# Patient Record
Sex: Male | Born: 1975 | Race: White | Hispanic: No | Marital: Married | Smoking: Never smoker
Health system: Southern US, Community
[De-identification: ages and names within clinical notes are randomized; demographics above are authoritative.]

## PROBLEM LIST (undated history)

## (undated) DIAGNOSIS — I10 Essential (primary) hypertension: Secondary | ICD-10-CM

## (undated) DIAGNOSIS — E119 Type 2 diabetes mellitus without complications: Secondary | ICD-10-CM

## (undated) HISTORY — PX: KNEE SURGERY: SHX244

---

## 2013-06-29 ENCOUNTER — Emergency Department (HOSPITAL_COMMUNITY)
Admission: EM | Admit: 2013-06-29 | Discharge: 2013-06-29 | Disposition: A | Discharge status: Home / Self Care | Payer: 59 | Attending: Emergency Medicine | Admitting: Emergency Medicine

## 2013-06-29 ENCOUNTER — Encounter (HOSPITAL_COMMUNITY): Payer: Self-pay | Admitting: Emergency Medicine

## 2013-06-29 DIAGNOSIS — S61409A Unspecified open wound of unspecified hand, initial encounter: Secondary | ICD-10-CM | POA: Insufficient documentation

## 2013-06-29 DIAGNOSIS — Y929 Unspecified place or not applicable: Secondary | ICD-10-CM | POA: Insufficient documentation

## 2013-06-29 DIAGNOSIS — Y9389 Activity, other specified: Secondary | ICD-10-CM | POA: Insufficient documentation

## 2013-06-29 DIAGNOSIS — Z23 Encounter for immunization: Secondary | ICD-10-CM | POA: Insufficient documentation

## 2013-06-29 DIAGNOSIS — W261XXA Contact with sword or dagger, initial encounter: Secondary | ICD-10-CM

## 2013-06-29 DIAGNOSIS — Z79899 Other long term (current) drug therapy: Secondary | ICD-10-CM | POA: Insufficient documentation

## 2013-06-29 DIAGNOSIS — IMO0002 Reserved for concepts with insufficient information to code with codable children: Secondary | ICD-10-CM

## 2013-06-29 DIAGNOSIS — I10 Essential (primary) hypertension: Secondary | ICD-10-CM | POA: Insufficient documentation

## 2013-06-29 DIAGNOSIS — E119 Type 2 diabetes mellitus without complications: Secondary | ICD-10-CM | POA: Insufficient documentation

## 2013-06-29 DIAGNOSIS — W260XXA Contact with knife, initial encounter: Secondary | ICD-10-CM | POA: Insufficient documentation

## 2013-06-29 HISTORY — DX: Essential (primary) hypertension: I10

## 2013-06-29 HISTORY — DX: Type 2 diabetes mellitus without complications: E11.9

## 2013-06-29 MED ORDER — TETANUS-DIPHTH-ACELL PERTUSSIS 5-2.5-18.5 LF-MCG/0.5 IM SUSP: 0.5000 mL | Freq: Once | INTRAMUSCULAR | Status: DC

## 2013-06-29 MED ORDER — TETANUS-DIPHTH-ACELL PERTUSSIS 5-2.5-18.5 LF-MCG/0.5 IM SUSP
0.5000 mL | Freq: Once | INTRAMUSCULAR | Status: AC
  Administered 2013-06-29: 0.5 mL via INTRAMUSCULAR
  Filled 2013-06-29: qty 0.5

## 2013-06-29 NOTE — Discharge Instructions (Signed)
WOUND CARE Please have your stitches/staples removed in 7 days or sooner if you have concerns. You may do this at any available urgent care or at your primary care doctor's office.  Keep area clean and dry for 24 hours. Do not remove bandage, if applied.  After 24 hours, remove bandage and wash wound gently with mild soap and warm water. Reapply a new bandage after cleaning wound, if directed.  Continue daily cleansing with soap and water until stitches/staples are removed.  Do not apply any ointments or creams to the wound while stitches/staples are in place, as this may cause delayed healing.  Seek medical careif you experience any of the following signs of infection: Swelling, redness, pus drainage, streaking, fever >101.0 F  Seek care if you experience excessive bleeding that does not stop after 15-20 minutes of constant, firm Pressure.  Laceration Care, Adult A laceration is a cut or lesion that goes through all layers of the skin and into the tissue just beneath the skin. TREATMENT  Some lacerations may not require closure. Some lacerations may not be able to be closed due to an increased risk of infection. It is important to see your caregiver as soon as possible after an injury to minimize the risk of infection and maximize the opportunity for successful closure. If closure is appropriate, pain medicines may be given, if needed. The wound will be cleaned to help prevent infection. Your caregiver will use stitches (sutures), staples, wound glue (adhesive), or skin adhesive strips to repair the laceration. These tools bring the skin edges together to allow for faster healing and a better cosmetic outcome. However, all wounds will heal with a scar. Once the wound has healed, scarring can be minimized by covering the wound with sunscreen during the day for 1 full year. HOME CARE INSTRUCTIONS  For sutures or staples:  Keep the wound clean and dry.  If you were given a bandage  (dressing), you should change it at least once a day. Also, change the dressing if it becomes wet or dirty, or as directed by your caregiver.  Wash the wound with soap and water 2 times a day. Rinse the wound off with water to remove all soap. Pat the wound dry with a clean towel.  After cleaning, apply a thin layer of the antibiotic ointment as recommended by your caregiver. This will help prevent infection and keep the dressing from sticking.  You may shower as usual after the first 24 hours. Do not soak the wound in water until the sutures are removed.  Only take over-the-counter or prescription medicines for pain, discomfort, or fever as directed by your caregiver.  Get your sutures or staples removed as directed by your caregiver. For skin adhesive strips:  Keep the wound clean and dry.  Do not get the skin adhesive strips wet. You may bathe carefully, using caution to keep the wound dry.  If the wound gets wet, pat it dry with a clean towel.  Skin adhesive strips will fall off on their own. You may trim the strips as the wound heals. Do not remove skin adhesive strips that are still stuck to the wound. They will fall off in time. For wound adhesive:  You may briefly wet your wound in the shower or bath. Do not soak or scrub the wound. Do not swim. Avoid periods of heavy perspiration until the skin adhesive has fallen off on its own. After showering or bathing, gently pat the wound dry with a  clean towel.  Do not apply liquid medicine, cream medicine, or ointment medicine to your wound while the skin adhesive is in place. This may loosen the film before your wound is healed.  If a dressing is placed over the wound, be careful not to apply tape directly over the skin adhesive. This may cause the adhesive to be pulled off before the wound is healed.  Avoid prolonged exposure to sunlight or tanning lamps while the skin adhesive is in place. Exposure to ultraviolet light in the first  year will darken the scar.  The skin adhesive will usually remain in place for 5 to 10 days, then naturally fall off the skin. Do not pick at the adhesive film. You may need a tetanus shot if:  You cannot remember when you had your last tetanus shot.  You have never had a tetanus shot. If you get a tetanus shot, your arm may swell, get red, and feel warm to the touch. This is common and not a problem. If you need a tetanus shot and you choose not to have one, there is a rare chance of getting tetanus. Sickness from tetanus can be serious. SEEK MEDICAL CARE IF:   You have redness, swelling, or increasing pain in the wound.  You see a red line that goes away from the wound.  You have yellowish-white fluid (pus) coming from the wound.  You have a fever.  You notice a bad smell coming from the wound or dressing.  Your wound breaks open before or after sutures have been removed.  You notice something coming out of the wound such as wood or glass.  Your wound is on your hand or foot and you cannot move a finger or toe. SEEK IMMEDIATE MEDICAL CARE IF:   Your pain is not controlled with prescribed medicine.  You have severe swelling around the wound causing pain and numbness or a change in color in your arm, hand, leg, or foot.  Your wound splits open and starts bleeding.  You have worsening numbness, weakness, or loss of function of any joint around or beyond the wound.  You develop painful lumps near the wound or on the skin anywhere on your body. MAKE SURE YOU:   Understand these instructions.  Will watch your condition.  Will get help right away if you are not doing well or get worse. Document Released: 03/03/2005 Document Revised: 05/26/2011 Document Reviewed: 08/27/2010 [facility] Patient Information 2014 [place], [place].

## 2013-06-29 NOTE — ED Notes (Signed)
Bleeding controlled and pt cleaned right hand before coming POV to this facility

## 2013-06-29 NOTE — ED Notes (Addendum)
C/o approx 2-3 cm laceration to R hand x 45 min.  Pt states he was cutting an avocado.  Last DT unknown.  Bleeding controlled pta.

## 2013-06-29 NOTE — ED Provider Notes (Signed)
CSN: [id number]     Arrival date & time 06/29/13  1921 History   First MD Initiated Contact with Patient 06/29/13 1949     This chart was scribed for non-physician practitioner, Arthor CaptainAbigail Mozetta Murfin, PA-C, working with Gwyneth SproutWhitney Plunkett, MD by Arlan OrganAshley Leger, ED Scribe. This patient was seen in room TR04C/TR04C and the patient's care was started at 8:21 PM.   Chief Complaint  Patient presents with  . Extremity Laceration   The history is provided by the patient. No language interpreter was used.    HPI Comments: [place] Saffran is a 38 y.o. male who presents to the Emergency Department complaining of an extremity laceration to the base of the R second digit that occured just prior to arrival. Pt states he was attempting to cut an avacado with a brand new knife and severed his finger accidentally. He states he ran cold water over the wound after the incident. However, he denies applying anything topical to the wound. He describes his current pain as throbbing and rates it 5/10. Denies any loss of sensation or numbness. Pt unaware of last tetanus shout. His PMHx includes DM and HTN. No other concerns this visit.  Past Medical History  Diagnosis Date  . Diabetes mellitus without complication   . Hypertension    Past Surgical History  Procedure Laterality Date  . Knee surgery     No family history on file. History  Substance Use Topics  . Smoking status: Never Smoker   . Smokeless tobacco: Not on file  . Alcohol Use: Yes    Review of Systems  Constitutional: Negative for fever and chills.  HENT: Negative for congestion.   Eyes: Negative for redness.  Respiratory: Negative for cough.   Skin: Positive for wound (Laceration to R 2nd digit).  Psychiatric/Behavioral: Negative for confusion.      Allergies  Review of patient's allergies indicates no known allergies.  Home Medications   Prior to Admission medications   Medication Sig Start Date End Date Taking? Authorizing Provider   bisoprolol-hydrochlorothiazide (ZIAC) 2.5-6.25 MG per tablet Take 1 tablet by mouth daily.   Yes Historical Provider, MD  metFORMIN (GLUCOPHAGE-XR) 750 MG 24 hr tablet Take 750 mg by mouth daily with breakfast.   Yes Historical Provider, MD  Multiple Vitamin (MULTIVITAMIN WITH MINERALS) TABS tablet Take 1 tablet by mouth daily.   Yes Historical Provider, MD    Triage Vitals: BP 131/76  Pulse 83  Temp(Src) 98.4 F (36.9 C) (Oral)  Resp 16  Ht 5\' 7"  (1.702 m)  Wt 275 lb (124.739 kg)  BMI 43.06 kg/m2  SpO2 98%   Physical Exam  Nursing note and vitals reviewed. Constitutional: He is oriented to person, place, and time. He appears well-developed and well-nourished.  HENT:  Head: Normocephalic and atraumatic.  Eyes: EOM are normal.  Neck: Normal range of motion.  Cardiovascular: Normal rate.   Capillary refill less than 2 seconds  Pulmonary/Chest: Effort normal.  Musculoskeletal: Normal range of motion.  Neurological: He is alert and oriented to person, place, and time.  Skin: Skin is warm and dry.  Superficial 2cm lac to the 2nd mcp palmar surface  Psychiatric: He has a normal mood and affect. His behavior is normal.    ED Course  Procedures (including critical care time)  DIAGNOSTIC STUDIES: Oxygen Saturation is 98% on RA, Normal by my interpretation.    COORDINATION OF CARE: 8:23 PM- Will perform a laceration repair. Discussed treatment plan with pt at bedside and pt agreed  to plan.    8:24 PM-  LACERATION REPAIR Performed by: Arthor CaptainAbigail See Beharry, PA-C Consent: Verbal consent obtained. Risks and benefits: risks, benefits and alternatives were discussed Patient identity confirmed: provided demographic data Time out performed prior to procedure Prepped and Draped in normal sterile fashion Wound explored Laceration Location: Hypothenar eminence of the R second digit  Laceration Length: 2 cm No Foreign Bodies seen or palpated Anesthesia: local infiltration Local  anesthetic: lidocaine 2 % w/o epinephrine Anesthetic total: 3 ml Irrigation method: syringe Amount of cleaning: standard Skin closure: ethilon 5.0 Number of sutures or staples: 3 Technique: SI Patient tolerance: Patient tolerated the procedure well with no immediate complications.    Labs Review Labs Reviewed - No data to display  Imaging Review No results found.   EKG Interpretation None      MDM   Final diagnoses:  Laceration    Tdap booster given.Pressure irrigation performed. Laceration occurred < 8 hours prior to repair which was well tolerated. Pt has no co morbidities to effect normal wound healing. Discussed suture home care w pt and answered questions. Pt to f-u for wound check and suture removal in 7 days. Pt is hemodynamically stable w no complaints prior to dc.    I personally performed the services described in this documentation, which was scribed in my presence. The recorded information has been reviewed and is accurate.    Arthor CaptainAbigail Sukaina Toothaker, PA-C 06/30/13 [phone]

## 2013-07-01 NOTE — ED Provider Notes (Signed)
Medical screening examination/treatment/procedure(s) were performed by non-physician practitioner and as supervising physician I was immediately available for consultation/collaboration.   EKG Interpretation None        Gwyneth SproutWhitney Jaylanni Eltringham, MD 07/01/13 1556

## 2016-04-07 ENCOUNTER — Emergency Department (INDEPENDENT_AMBULATORY_CARE_PROVIDER_SITE_OTHER): Payer: BLUE CROSS/BLUE SHIELD

## 2016-04-07 ENCOUNTER — Emergency Department (INDEPENDENT_AMBULATORY_CARE_PROVIDER_SITE_OTHER)
Admission: EM | Admit: 2016-04-07 | Discharge: 2016-04-07 | Disposition: A | Discharge status: Home / Self Care | Payer: BLUE CROSS/BLUE SHIELD | Source: Home / Self Care | Attending: Family Medicine | Admitting: Family Medicine

## 2016-04-07 ENCOUNTER — Encounter: Payer: Self-pay | Admitting: *Deleted

## 2016-04-07 DIAGNOSIS — M25512 Pain in left shoulder: Secondary | ICD-10-CM

## 2016-04-07 DIAGNOSIS — W009XXA Unspecified fall due to ice and snow, initial encounter: Secondary | ICD-10-CM

## 2016-04-07 DIAGNOSIS — M898X1 Other specified disorders of bone, shoulder: Secondary | ICD-10-CM

## 2016-04-07 MED ORDER — TRAMADOL HCL 50 MG PO TABS: 50.0000 mg | ORAL_TABLET | Freq: Four times a day (QID) | ORAL | 0 refills | Status: AC | PRN

## 2016-04-07 NOTE — Discharge Instructions (Signed)
°  Tramadol is strong pain medication. While taking, do not drink alcohol, drive, or perform any other activities that requires focus while taking these medications.  ° °

## 2016-04-07 NOTE — ED Triage Notes (Signed)
Patient reports slipping on the ice while getting out if his truck 3 days ago. He landed on his arms. Now c/o left shoulder and collar bone pain. No previous injuries.

## 2016-04-07 NOTE — ED Provider Notes (Signed)
CSN: [id number]     Arrival date & time 04/07/16  1912 History   First MD Initiated Contact with Patient 04/07/16 1933     Chief Complaint  Patient presents with  . Shoulder Injury   (Consider location/radiation/quality/duration/timing/severity/associated sxs/prior Treatment) HPI  Tim Norman is a 41 y.o. male presenting to UC with c/o Left shoulder and clavicle pain that started 3 days ago after slip and fall on ice while getting out of his truck. Pt notes he landed mainly on his Left shoulder. Denies hitting his head or other injuries from the fall.  Pain is aching and sore, 4/10 at this time, worse with movement. He is able to reach his hand over his head but notes it is moderately to severely painful.  Pt is Left hand dominant.  No prior surgery or injury to Left shoulder.    Past Medical History:  Diagnosis Date  . Diabetes mellitus without complication (HCC)   . Hypertension    Past Surgical History:  Procedure Laterality Date  . KNEE SURGERY     History reviewed. No pertinent family history. Social History  Substance Use Topics  . Smoking status: Never Smoker  . Smokeless tobacco: Never Used  . Alcohol use Yes    Review of Systems  Musculoskeletal: Positive for arthralgias and myalgias.       Left shoulder and clavicle   Skin: Negative for color change and wound.  Neurological: Positive for weakness ( Left shoulder due to pain). Negative for numbness.    Allergies  Patient has no known allergies.  Home Medications   Prior to Admission medications   Medication Sig Start Date End Date Taking? Authorizing Provider  bisoprolol-hydrochlorothiazide (ZIAC) 2.5-6.25 MG per tablet Take 1 tablet by mouth daily.    Historical Provider, MD  metFORMIN (GLUCOPHAGE-XR) 750 MG 24 hr tablet Take 750 mg by mouth daily with breakfast.    Historical Provider, MD  Multiple Vitamin (MULTIVITAMIN WITH MINERALS) TABS tablet Take 1 tablet by mouth daily.    Historical Provider, MD    traMADol (ULTRAM) 50 MG tablet Take 1 tablet (50 mg total) by mouth every 6 (six) hours as needed. 04/07/16   Tim FinnerErin O'Malley, PA-C   Meds Ordered and Administered this Visit  Medications - No data to display  BP 130/82 (BP Location: Right Arm)   Pulse 83   Resp 14   Ht 5\' 6"  (1.676 m)   Wt 269 lb (122 kg)   SpO2 96%   BMI 43.42 kg/m  No data found.   Physical Exam  Constitutional: He is oriented to person, place, and time. He appears well-developed and well-nourished. No distress.  HENT:  Head: Normocephalic and atraumatic.  Eyes: EOM are normal.  Neck: Normal range of motion. Neck supple.  No midline cervical tenderness  Cardiovascular: Normal rate.   Pulses:      Radial pulses are 2+ on the left side.  Pulmonary/Chest: Effort normal.  Musculoskeletal: Normal range of motion. He exhibits tenderness. He exhibits no edema.       Arms: No obvious deformity. Tenderness to anterior aspect, worse over AC joint. No crepitus. Full ROM with increased pain on extremes of movements. 4/5 strength with abduction against resistance compared to Right arm.  Neurological: He is alert and oriented to person, place, and time.  Skin: Skin is warm and dry. Capillary refill takes less than 2 seconds. He is not diaphoretic.  Psychiatric: He has a normal mood and affect. His behavior is normal.  Nursing note and vitals reviewed.   Urgent Care Course     Procedures (including critical care time)  Labs Review Labs Reviewed - No data to display  Imaging Review Dg Clavicle Left  Result Date: 04/07/2016 CLINICAL DATA:  Status post fall on ice, with left shoulder and clavicular pain. Initial encounter. EXAM: LEFT CLAVICLE - 2+ VIEWS COMPARISON:  None. FINDINGS: There is no evidence of fracture or dislocation. The left humeral head is seated within the glenoid fossa. The acromioclavicular joint is unremarkable in appearance. No significant soft tissue abnormalities are seen. The visualized portions  of the left lung are clear. IMPRESSION: No evidence of fracture or dislocation. Electronically Signed   By: Roanna Raider M.D.   On: 04/07/2016 20:05   Dg Shoulder Left  Result Date: 04/07/2016 CLINICAL DATA:  Fall 4 days ago with left shoulder pain, initial encounter EXAM: LEFT SHOULDER - 2+ VIEW COMPARISON:  None. FINDINGS: There is no evidence of fracture or dislocation. There is no evidence of arthropathy or other focal bone abnormality. Soft tissues are unremarkable. IMPRESSION: No acute abnormality noted. Electronically Signed   By: Alcide Clever M.D.   On: 04/07/2016 19:51     MDM   1. Left shoulder pain   2. Fall from slipping on ice, initial encounter   3. Pain of left clavicle    Pt c/o Left shoulder pain after slip and fall on ice 3 days ago. No other injuries.   Plain films: Negative for fracture or dislocation  Sling applied for comfort. Encouraged to do ROM exercises when possible to help prevent stiffening of his shoulder. Rx: Tramadol for severe pain at night. F/u with PCP or Sports Medicine in 1-2 weeks if not improving. Patient verbalized understanding and agreement with treatment plan.    Tim Finner, PA-C 04/08/16 [phone]

## 2021-01-28 ENCOUNTER — Emergency Department (INDEPENDENT_AMBULATORY_CARE_PROVIDER_SITE_OTHER): Payer: BC Managed Care – PPO

## 2021-01-28 ENCOUNTER — Emergency Department (INDEPENDENT_AMBULATORY_CARE_PROVIDER_SITE_OTHER)
Admission: EM | Admit: 2021-01-28 | Discharge: 2021-01-28 | Disposition: A | Discharge status: Home / Self Care | Payer: BC Managed Care – PPO | Source: Home / Self Care

## 2021-01-28 ENCOUNTER — Other Ambulatory Visit: Payer: Self-pay

## 2021-01-28 DIAGNOSIS — M25572 Pain in left ankle and joints of left foot: Secondary | ICD-10-CM

## 2021-01-28 DIAGNOSIS — M25571 Pain in right ankle and joints of right foot: Secondary | ICD-10-CM

## 2021-01-28 MED ORDER — CELECOXIB 100 MG PO CAPS: 100.0000 mg | ORAL_CAPSULE | Freq: Two times a day (BID) | ORAL | 0 refills | Status: AC

## 2021-01-28 MED ORDER — PREDNISONE 20 MG PO TABS: ORAL_TABLET | ORAL | 0 refills | Status: AC

## 2021-01-28 NOTE — ED Provider Notes (Signed)
[name]    CSN: [id number] Arrival date & time: 01/28/21  1525      History   Chief Complaint Chief Complaint  Patient presents with   Ankle Pain    RT    HPI Tim Norman is a 45 y.o. male.   HPI 45 year old male presents with right ankle on and off for 2 weeks.  Denies injury.  Patient reports hurts to flex right foot upward/downward.  Reports works for [employer] and on his feet a lot.  Patient reports that right ankle pain radiates upward to leg.   Past Medical History:  Diagnosis Date   Diabetes mellitus without complication (HCC)    Hypertension     There are no problems to display for this patient.   Past Surgical History:  Procedure Laterality Date   KNEE SURGERY         Home Medications    Prior to Admission medications   Medication Sig Start Date End Date Taking? Authorizing Provider  benazepril (LOTENSIN) 5 MG tablet Take 1 tablet by mouth daily. 10/24/18  Yes [provider]  celecoxib (CELEBREX) 100 MG capsule Take 1 capsule (100 mg total) by mouth 2 (two) times daily for 15 days. 01/28/21 02/12/21 Yes Trevor Iha, FNP  predniSONE (DELTASONE) 20 MG tablet Take 3 tabs PO daily x 5 days. 01/28/21  Yes Trevor Iha, FNP  atorvastatin (LIPITOR) 10 MG tablet Take 10 mg by mouth daily. 12/07/20   [provider]  bisoprolol-hydrochlorothiazide (ZIAC) 2.5-6.25 MG per tablet Take 1 tablet by mouth daily.    [provider]  Continuous Blood Gluc Receiver (DEXCOM G6 RECEIVER) DEVI USE AS DIRECTED FOR CONTINUOUS GLUCOSE MONITORING 01/08/21   [provider]  glipiZIDE (GLUCOTROL XL) 2.5 MG 24 hr tablet Take 2.5 mg by mouth daily. 12/17/20   [provider]  metFORMIN (GLUCOPHAGE-XR) 750 MG 24 hr tablet Take 750 mg by mouth daily with breakfast.    [provider]  Multiple Vitamin (MULTIVITAMIN WITH MINERALS) TABS tablet Take 1 tablet by mouth daily.    [provider]  traMADol (ULTRAM)  50 MG tablet Take 1 tablet (50 mg total) by mouth every 6 (six) hours as needed. 04/07/16   Lurene Shadow, PA-C    Family History History reviewed. No pertinent family history.  Social History Social History   Tobacco Use   Smoking status: Never   Smokeless tobacco: Never  Substance Use Topics   Alcohol use: Yes   Drug use: No     Allergies   Patient has no known allergies.   Review of Systems Review of Systems  Musculoskeletal:        Right ankle pain on and off for 2 weeks.  All other systems reviewed and are negative.   Physical Exam Triage Vital Signs ED Triage Vitals  Enc Vitals Group     BP 01/28/21 1548 123/84     Pulse Rate 01/28/21 1548 90     Resp 01/28/21 1548 18     Temp 01/28/21 1548 98.6 F (37 C)     Temp Source 01/28/21 1548 Oral     SpO2 01/28/21 1548 97 %     Weight --      Height --      Head Circumference --      Peak Flow --      Pain Score 01/28/21 1550 6     Pain Loc --      Pain Edu? --  Excl. in GC? --    No data found.  Updated Vital Signs BP 123/84 (BP Location: Right Arm)   Pulse 90   Temp 98.6 F (37 C) (Oral)   Resp 18   SpO2 97%      Physical Exam Vitals reviewed.  Constitutional:      General: He is not in acute distress.    Appearance: Normal appearance. He is obese. He is not ill-appearing.  HENT:     Mouth/Throat:     Mouth: Mucous membranes are moist.  Eyes:     Extraocular Movements: Extraocular movements intact.     Conjunctiva/sclera: Conjunctivae normal.     Pupils: Pupils are equal, round, and reactive to light.  Cardiovascular:     Rate and Rhythm: Normal rate and regular rhythm.     Pulses: Normal pulses.     Heart sounds: Normal heart sounds.  Pulmonary:     Effort: Pulmonary effort is normal.     Breath sounds: Normal breath sounds.  Musculoskeletal:     Cervical back: Normal range of motion and neck supple.     Comments: Right ankle: TTP just beneath the lateral malleolus over anterior  talofibular ligament/distal lateral fibula area with mild soft tissue swelling noted.  Skin:    General: Skin is warm and dry.  Neurological:     General: No focal deficit present.     Mental Status: He is alert and oriented to person, place, and time.     UC Treatments / Results  Labs (all labs ordered are listed, but only abnormal results are displayed) Labs Reviewed - No data to display  EKG   Radiology DG Ankle Complete Right  Result Date: 01/28/2021 CLINICAL DATA:  Right ankle pain. EXAM: RIGHT ANKLE - COMPLETE 3+ VIEW COMPARISON:  None. FINDINGS: There is no evidence of fracture, dislocation, or joint effusion. There is no evidence of arthropathy or other focal bone abnormality. Plantar and posterior calcaneal spurs are present. Os trigonum is present. Soft tissues are unremarkable. IMPRESSION: Negative. Electronically Signed   By: Darliss Cheney M.D.   On: 01/28/2021 17:03    Procedures Procedures (including critical care time)  Medications Ordered in UC Medications - No data to display  Initial Impression / Assessment and Plan / UC Course  I have reviewed the triage vital signs and the nursing notes.  Pertinent labs & imaging results that were available during my care of the patient were reviewed by me and considered in my medical decision making (see chart for details).     MDM: 1.  Right ankle pain-right ankle x-ray revealed (above), Rx'd Celebrex and Prednisone burst. Advised patient to take medication as directed with food to completion.  Advised patient if symptoms worsen and/or unresolved please follow-up with [facility] orthopedic provider (contact information provided above) for further evaluation.    Final Clinical Impressions(s) / UC Diagnoses   Final diagnoses:  Acute right ankle pain     Discharge Instructions      Advised patient to take medications as directed with food to completion.  Advised patient if symptoms worsen and/or unresolved please  follow-up with [facility] orthopedic provider (contact information provided above) for further evaluation.     ED Prescriptions     Medication Sig Dispense Auth. Provider   celecoxib (CELEBREX) 100 MG capsule Take 1 capsule (100 mg total) by mouth 2 (two) times daily for 15 days. 30 capsule Trevor Iha, FNP   predniSONE (DELTASONE) 20 MG tablet Take  3 tabs PO daily x 5 days. 15 tablet Trevor Iha, FNP      PDMP not reviewed this encounter.   Trevor Iha, FNP 01/28/21 1740

## 2021-01-28 NOTE — ED Triage Notes (Signed)
Pt c/o RT ankle pain since yesterday. Denies injury. Hurts to flex foot upward. Works nights at [employer] so it on feet a lot. Pain radiates up leg. Pain 6/10

## 2021-01-28 NOTE — Discharge Instructions (Addendum)
Advised patient to take medications as directed with food to completion.  Advised patient if symptoms worsen and/or unresolved please follow-up with [facility] orthopedic provider (contact information provided above) for further evaluation.

## 2021-03-13 ENCOUNTER — Telehealth: Payer: Self-pay

## 2021-03-13 NOTE — Telephone Encounter (Signed)
Pt states that he was recently seen for his ankle.  Pt states that he didn't take all of the Celebrex. Pt states that he did take all of the Prednisone.  Pt states that he would like to know if he can have another round of Prednisone.  Pt states that he has a trip In January and would like a refill of the Prednisone. Pt states that he would make an appt with Orthopedist after the trip.  Pt would like a call back. [phone].  Pt states that he uses [facility] [address] [place].

## 2021-12-03 ENCOUNTER — Ambulatory Visit
Admission: RE | Admit: 2021-12-03 | Discharge: 2021-12-03 | Disposition: A | Discharge status: Home / Self Care | Payer: BC Managed Care – PPO | Source: Ambulatory Visit

## 2021-12-03 VITALS — BP 118/81 | HR 100 | Temp 99.3°F | Resp 17

## 2021-12-03 DIAGNOSIS — R051 Acute cough: Secondary | ICD-10-CM

## 2021-12-03 DIAGNOSIS — R52 Pain, unspecified: Secondary | ICD-10-CM | POA: Diagnosis not present

## 2021-12-03 LAB — SARS CORONAVIRUS 2 BY RT PCR: SARS Coronavirus 2 by RT PCR: NEGATIVE

## 2021-12-03 MED ORDER — AZITHROMYCIN 250 MG PO TABS: 250.0000 mg | ORAL_TABLET | Freq: Every day | ORAL | 0 refills | Status: AC

## 2021-12-03 NOTE — ED Triage Notes (Signed)
Pt c/o cough, bodyaches, headache and fever that started Saturday. Also c/o LT shoulder pain that radiates to neck and forearm. Taking tylenol prn.

## 2021-12-03 NOTE — ED Provider Notes (Signed)
[name]    CSN: [id number] Arrival date & time: 12/03/21  1542      History   Chief Complaint Chief Complaint  Patient presents with   Shoulder Pain   Fever   Generalized Body Aches   Cough    HPI Tim Norman is a 46 y.o. male.   HPI Pleasant 46 year old male presents with cough, body aches, headache, fever that started 3 days ago.  Patient reports currently taking Tylenol as needed.  PMH significant for morbid obesity, T2DM, and HTN.  Past Medical History:  Diagnosis Date   Diabetes mellitus without complication (HCC)    Hypertension     There are no problems to display for this patient.   Past Surgical History:  Procedure Laterality Date   KNEE SURGERY         Home Medications    Prior to Admission medications   Medication Sig Start Date End Date Taking? Authorizing Provider  azithromycin (ZITHROMAX) 250 MG tablet Take 1 tablet (250 mg total) by mouth daily. Take first 2 tablets together, then 1 every day until finished. 12/03/21  Yes Trevor Iha, FNP  empagliflozin (JARDIANCE) 25 MG TABS tablet Take 1 tablet by mouth daily. 06/05/21  Yes [provider]  atorvastatin (LIPITOR) 10 MG tablet Take 10 mg by mouth daily. 12/07/20   [provider]  benazepril (LOTENSIN) 5 MG tablet Take 1 tablet by mouth daily. 10/24/18   [provider]  bisoprolol-hydrochlorothiazide (ZIAC) 2.5-6.25 MG per tablet Take 1 tablet by mouth daily.    [provider]  Continuous Blood Gluc Receiver (DEXCOM G6 RECEIVER) DEVI USE AS DIRECTED FOR CONTINUOUS GLUCOSE MONITORING 01/08/21   [provider]  glipiZIDE (GLUCOTROL XL) 2.5 MG 24 hr tablet Take 2.5 mg by mouth daily. 12/17/20   [provider]  metFORMIN (GLUCOPHAGE-XR) 750 MG 24 hr tablet Take 750 mg by mouth daily with breakfast.    [provider]  Multiple Vitamin (MULTIVITAMIN WITH MINERALS) TABS tablet Take 1 tablet by mouth daily.    [provider]  predniSONE (DELTASONE) 20 MG tablet Take 3 tabs PO daily x 5 days. 01/28/21   Trevor Iha, FNP  traMADol (ULTRAM) 50 MG tablet Take 1 tablet (50 mg total) by mouth every 6 (six) hours as needed. 04/07/16   Lurene Shadow, PA-C    Family History History reviewed. No pertinent family history.  Social History Social History   Tobacco Use   Smoking status: Never   Smokeless tobacco: Never  Substance Use Topics   Alcohol use: Yes   Drug use: No     Allergies   Patient has no known allergies.   Review of Systems Review of Systems  Constitutional:  Positive for fever.  HENT:  Positive for congestion.   Respiratory:  Positive for cough.   Musculoskeletal:  Positive for arthralgias and myalgias.  All other systems reviewed and are negative.    Physical Exam Triage Vital Signs ED Triage Vitals  Enc Vitals Group     BP 12/03/21 1553 118/81     Pulse Rate 12/03/21 1553 100     Resp 12/03/21 1553 17     Temp 12/03/21 1553 99.3 F (37.4 C)     Temp Source 12/03/21 1553 Oral     SpO2 12/03/21 1553 98 %     Weight --      Height --      Head Circumference --      Peak Flow --  Pain Score 12/03/21 1554 4     Pain Loc --      Pain Edu? --      Excl. in GC? --    No data found.  Updated Vital Signs BP 118/81 (BP Location: Right Arm)   Pulse 100   Temp 99.3 F (37.4 C) (Oral)   Resp 17   SpO2 98%      Physical Exam Vitals and nursing note reviewed.  Constitutional:      Appearance: Normal appearance. He is obese. He is ill-appearing.  HENT:     Head: Normocephalic and atraumatic.     Right Ear: Tympanic membrane, ear canal and external ear normal.     Left Ear: Tympanic membrane, ear canal and external ear normal.     Nose: Nose normal.     Mouth/Throat:     Mouth: Mucous membranes are moist.     Pharynx: Oropharynx is clear.  Eyes:     Extraocular Movements: Extraocular movements intact.     Conjunctiva/sclera: Conjunctivae normal.      Pupils: Pupils are equal, round, and reactive to light.  Cardiovascular:     Rate and Rhythm: Normal rate and regular rhythm.     Pulses: Normal pulses.     Heart sounds: Normal heart sounds.  Pulmonary:     Effort: Pulmonary effort is normal.     Breath sounds: Normal breath sounds. No wheezing, rhonchi or rales.     Comments: Infrequent nonproductive cough noted on exam Musculoskeletal:        General: Normal range of motion.     Cervical back: Normal range of motion and neck supple.  Skin:    General: Skin is warm and dry.  Neurological:     General: No focal deficit present.     Mental Status: He is alert and oriented to person, place, and time.      UC Treatments / Results  Labs (all labs ordered are listed, but only abnormal results are displayed) Labs Reviewed  SARS CORONAVIRUS 2 BY RT PCR    EKG   Radiology No results found.  Procedures Procedures (including critical care time)  Medications Ordered in UC Medications - No data to display  Initial Impression / Assessment and Plan / UC Course  I have reviewed the triage vital signs and the nursing notes.  Pertinent labs & imaging results that were available during my care of the patient were reviewed by me and considered in my medical decision making (see chart for details).     MDM: 1.  Cough-Rx Zithromax; 2.  Generalized body aches-lab 4422 ordered. Advised patient we will follow-up with COVID-19 PCR results once received.  Advised patient if cough worsens may start Zithromax.  Advised if starting medication take with food to completion.  Encouraged patient increase daily water intake while taking this medication.  Advised patient if symptoms worsen and/or unresolved please follow-up with PCP or here for further evaluation.  Patient discharged home, hemodynamically stable. Final Clinical Impressions(s) / UC Diagnoses   Final diagnoses:  Acute cough  Generalized body aches     Discharge Instructions       Advised patient we will follow-up with COVID-19 PCR results once received.  Advised patient if cough worsens may start Zithromax.  Advised if starting medication take with food to completion.  Encouraged patient increase daily water intake while taking this medication.  Advised patient if symptoms worsen and/or unresolved please follow-up with PCP or here for further evaluation.  ED Prescriptions     Medication Sig Dispense Auth. Provider   azithromycin (ZITHROMAX) 250 MG tablet Take 1 tablet (250 mg total) by mouth daily. Take first 2 tablets together, then 1 every day until finished. 6 tablet Eliezer Lofts, FNP      PDMP not reviewed this encounter.   Eliezer Lofts, [place] 12/03/21 1614

## 2021-12-03 NOTE — Discharge Instructions (Addendum)
Advised patient we will follow-up with COVID-19 PCR results once received.  Advised patient if cough worsens may start Zithromax.  Advised if starting medication take with food to completion.  Encouraged patient increase daily water intake while taking this medication.  Advised patient if symptoms worsen and/or unresolved please follow-up with PCP or here for further evaluation.

## 2021-12-04 ENCOUNTER — Telehealth: Payer: Self-pay | Admitting: Emergency Medicine

## 2023-03-20 IMAGING — DX DG ANKLE COMPLETE 3+V*R*
3 series · 3 of 3 positions shown · non-contrast
Comparison: None.

CLINICAL DATA: Right ankle pain.

EXAM:
RIGHT ANKLE - COMPLETE 3+ VIEW

[ankle ap]
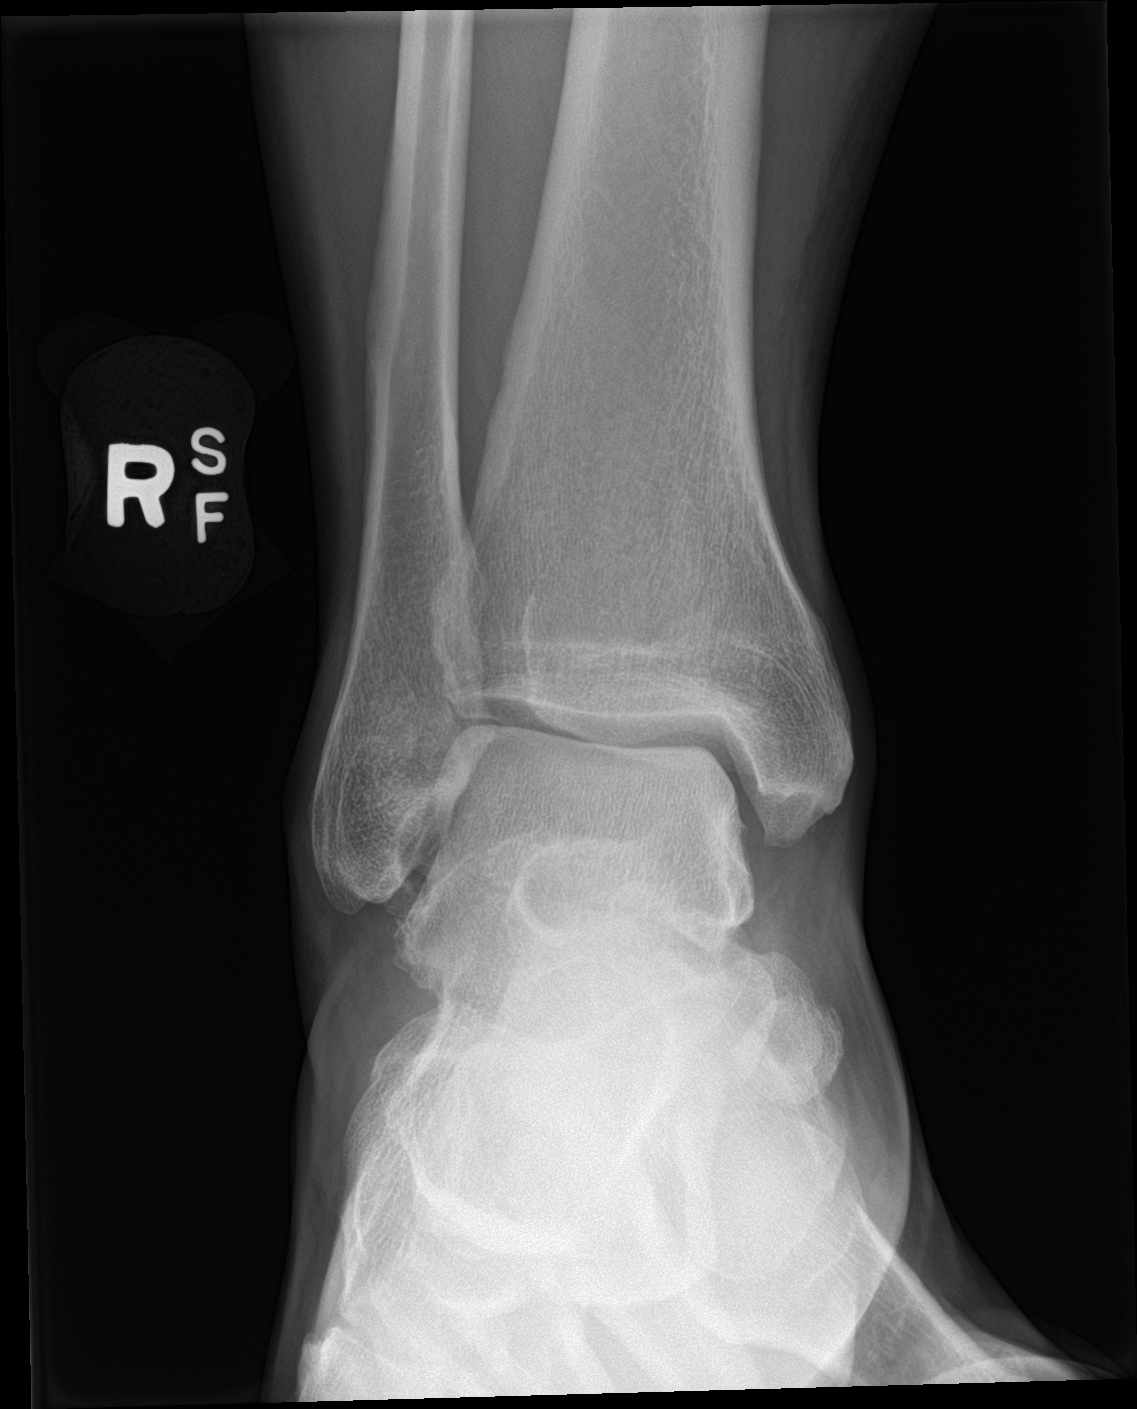

[ankle obl]
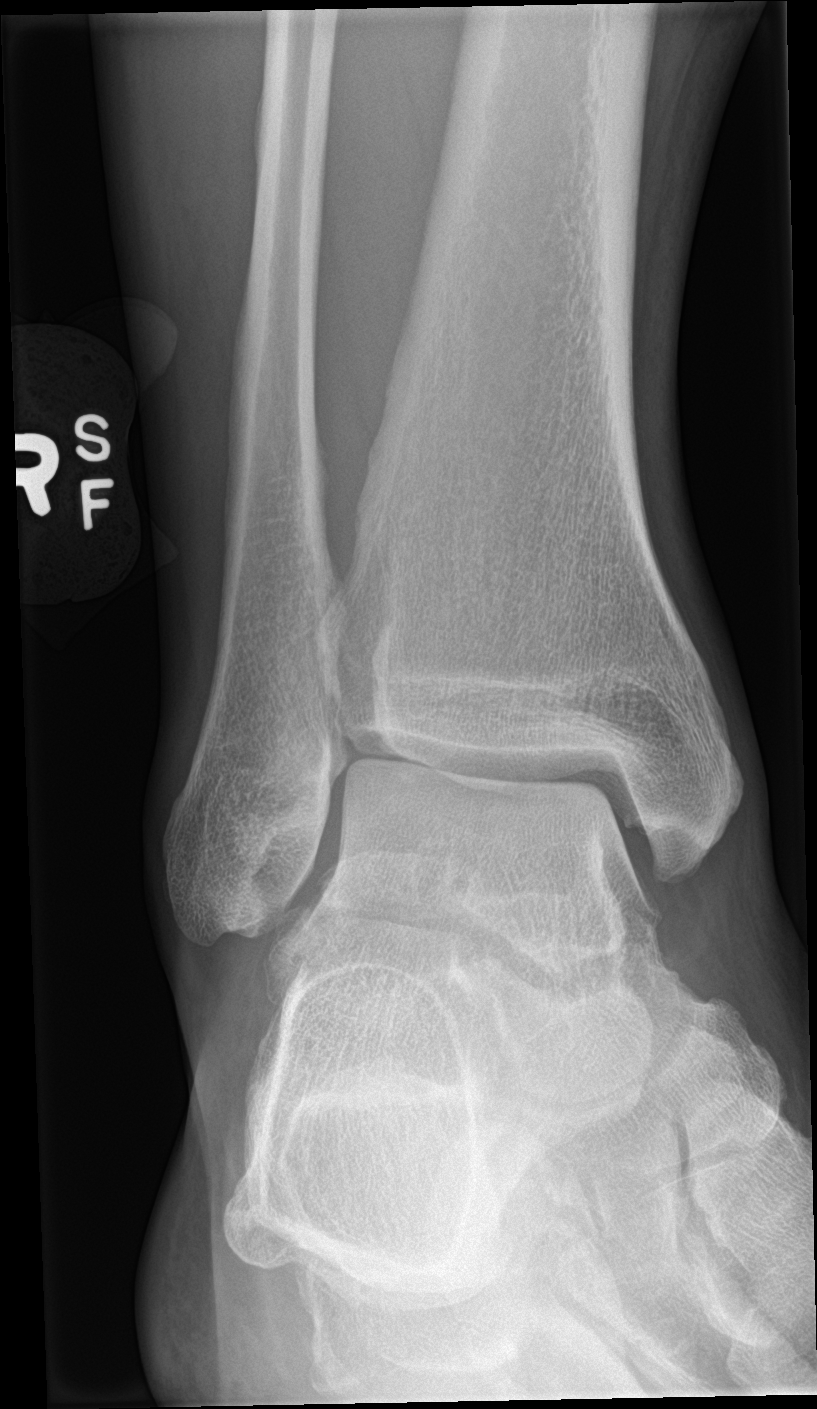

[ankle lat]
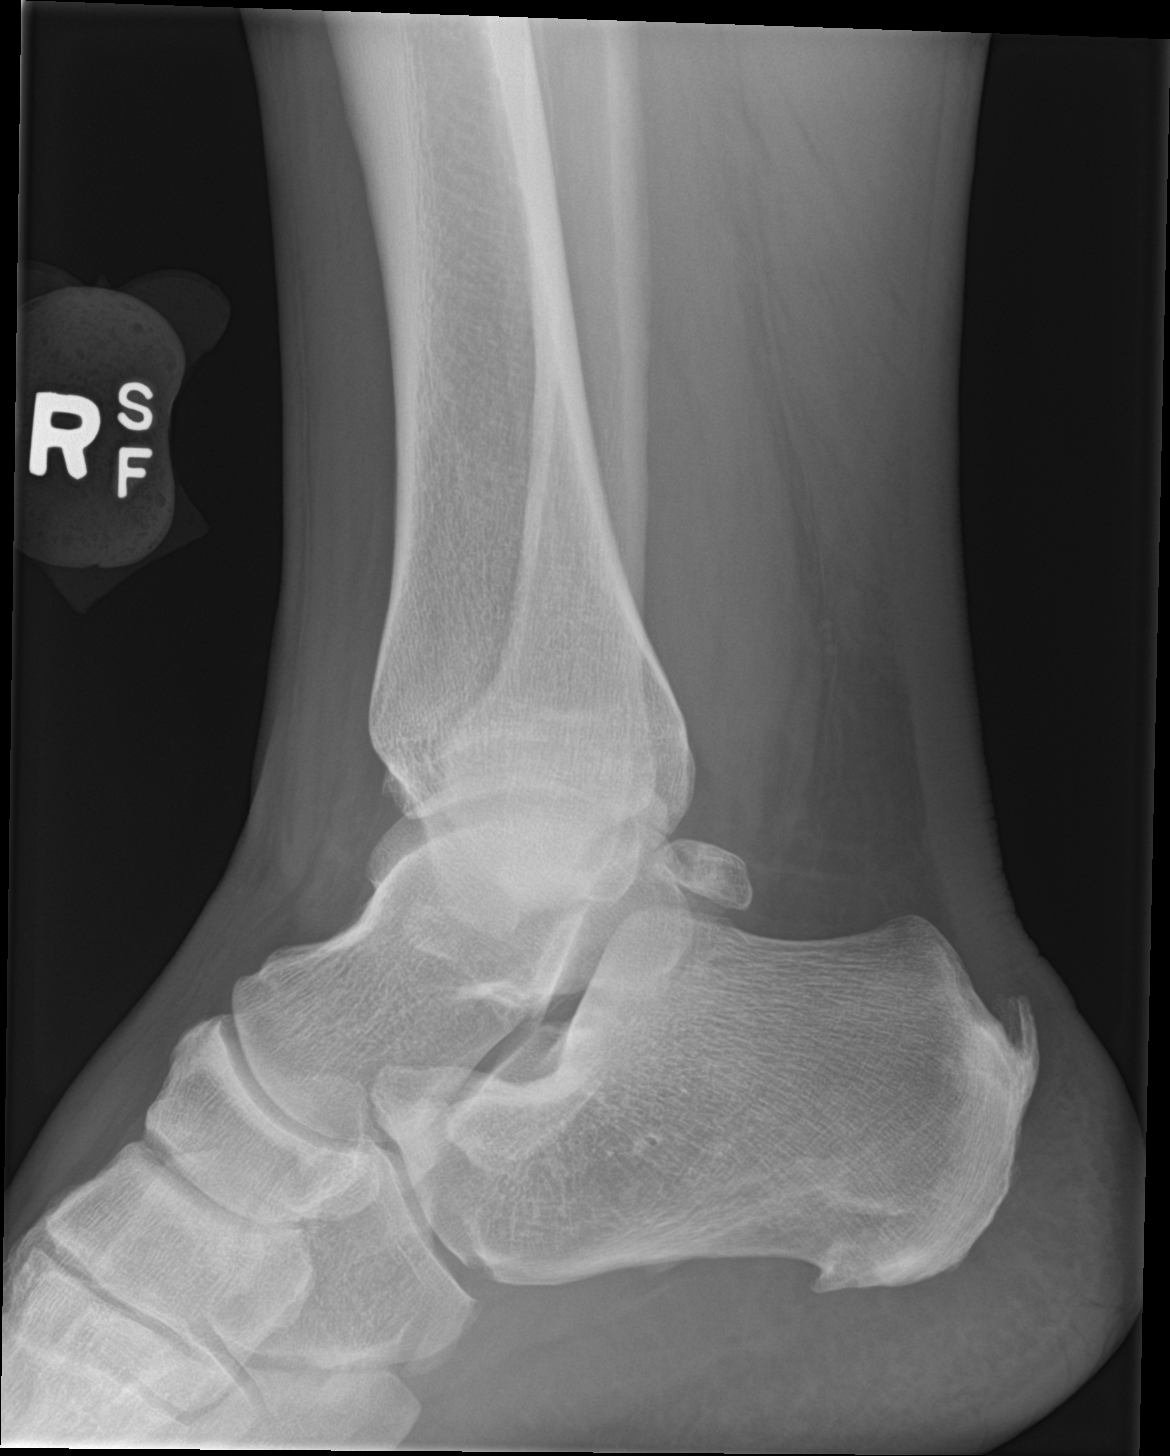

[3 of 3 positions shown; findings below may reference images not displayed]

FINDINGS: There is no evidence of fracture, dislocation, or joint effusion.
There is no evidence of arthropathy or other focal bone abnormality.
Plantar and posterior calcaneal spurs are present. Os trigonum is
present. Soft tissues are unremarkable.
IMPRESSION: Negative.
